# Patient Record
Sex: Male | Born: 1960 | Race: White | Hispanic: No | Marital: Married | Smoking: Never smoker
Health system: Southern US, Community
[De-identification: ages and names within clinical notes are randomized; demographics above are authoritative.]

## PROBLEM LIST (undated history)

## (undated) DIAGNOSIS — L309 Dermatitis, unspecified: Secondary | ICD-10-CM

## (undated) DIAGNOSIS — N529 Male erectile dysfunction, unspecified: Secondary | ICD-10-CM

## (undated) DIAGNOSIS — G4733 Obstructive sleep apnea (adult) (pediatric): Secondary | ICD-10-CM

## (undated) DIAGNOSIS — Z8249 Family history of ischemic heart disease and other diseases of the circulatory system: Secondary | ICD-10-CM

## (undated) DIAGNOSIS — E785 Hyperlipidemia, unspecified: Secondary | ICD-10-CM

## (undated) DIAGNOSIS — J45909 Unspecified asthma, uncomplicated: Secondary | ICD-10-CM

## (undated) DIAGNOSIS — E669 Obesity, unspecified: Secondary | ICD-10-CM

## (undated) HISTORY — DX: Unspecified asthma, uncomplicated: J45.909

## (undated) HISTORY — DX: Obesity, unspecified: E66.9

## (undated) HISTORY — PX: VASECTOMY: SHX75

## (undated) HISTORY — DX: Obstructive sleep apnea (adult) (pediatric): G47.33

## (undated) HISTORY — DX: Male erectile dysfunction, unspecified: N52.9

## (undated) HISTORY — DX: Family history of ischemic heart disease and other diseases of the circulatory system: Z82.49

## (undated) HISTORY — DX: Dermatitis, unspecified: L30.9

## (undated) HISTORY — PX: HERNIA REPAIR: SHX51

## (undated) HISTORY — DX: Hyperlipidemia, unspecified: E78.5

---

## 2001-05-28 ENCOUNTER — Ambulatory Visit (HOSPITAL_BASED_OUTPATIENT_CLINIC_OR_DEPARTMENT_OTHER): Admission: RE | Admit: 2001-05-28 | Discharge: 2001-05-28 | Payer: Self-pay | Admitting: Sports Medicine

## 2012-11-16 ENCOUNTER — Encounter: Payer: Self-pay | Admitting: Cardiology

## 2012-11-16 ENCOUNTER — Encounter: Payer: Self-pay | Admitting: *Deleted

## 2012-11-16 DIAGNOSIS — L309 Dermatitis, unspecified: Secondary | ICD-10-CM | POA: Insufficient documentation

## 2012-11-16 DIAGNOSIS — N529 Male erectile dysfunction, unspecified: Secondary | ICD-10-CM | POA: Insufficient documentation

## 2012-11-16 DIAGNOSIS — E785 Hyperlipidemia, unspecified: Secondary | ICD-10-CM | POA: Insufficient documentation

## 2012-11-16 DIAGNOSIS — E669 Obesity, unspecified: Secondary | ICD-10-CM | POA: Insufficient documentation

## 2012-11-16 DIAGNOSIS — G4733 Obstructive sleep apnea (adult) (pediatric): Secondary | ICD-10-CM | POA: Insufficient documentation

## 2012-11-16 DIAGNOSIS — J45909 Unspecified asthma, uncomplicated: Secondary | ICD-10-CM | POA: Insufficient documentation

## 2012-11-17 ENCOUNTER — Encounter: Payer: Self-pay | Admitting: Cardiology

## 2012-11-17 ENCOUNTER — Encounter (INDEPENDENT_AMBULATORY_CARE_PROVIDER_SITE_OTHER): Payer: Self-pay

## 2012-11-17 ENCOUNTER — Ambulatory Visit (INDEPENDENT_AMBULATORY_CARE_PROVIDER_SITE_OTHER): Payer: BC Managed Care – PPO | Admitting: Cardiology

## 2012-11-17 VITALS — BP 145/86 | HR 58 | Ht 71.0 in | Wt 291.0 lb

## 2012-11-17 DIAGNOSIS — R079 Chest pain, unspecified: Secondary | ICD-10-CM

## 2012-11-17 DIAGNOSIS — E785 Hyperlipidemia, unspecified: Secondary | ICD-10-CM

## 2012-11-17 DIAGNOSIS — Z8249 Family history of ischemic heart disease and other diseases of the circulatory system: Secondary | ICD-10-CM | POA: Insufficient documentation

## 2012-11-17 DIAGNOSIS — G4733 Obstructive sleep apnea (adult) (pediatric): Secondary | ICD-10-CM

## 2012-11-17 HISTORY — DX: Family history of ischemic heart disease and other diseases of the circulatory system: Z82.49

## 2012-11-17 MED ORDER — NITROGLYCERIN 0.4 MG SL SUBL: 0.4000 mg | SUBLINGUAL_TABLET | SUBLINGUAL | Status: DC | PRN

## 2012-11-17 NOTE — Patient Instructions (Addendum)
Your physician has requested that you have an exercise tolerance test. For further information please visit [email or web address] Please also follow instruction sheet, as given.  Your physician wants you to follow-up in: 2 Years with Dr. Anne Fu. You will receive a reminder letter in the mail two months in advance. If you don't receive a letter, please call our office to schedule the follow-up appointment.  Rx for [employer] sent into [facility].

## 2012-11-17 NOTE — Progress Notes (Signed)
[address] [place], [place]  [address] Phone: [phone] Fax:  [phone]  Date:  11/17/2012   ID:  Herbert Wilson, DOB [birth year], MRN [id number]  PCP:  Farris Has, MD   History of Present Illness: Herbert Wilson is a 52 y.o. male here for the evaluation of chest pain with family history of coronary artery disease, hyperlipidemia, obesity.  Chest pain has been intermittent over the past 3-4 years no previous stress test. His father had his first myocardial infarction in his 79s with subsequent CABG. His father used to take nitroglycerin for his chest pain. Can feels CP with or without exertion. More frequent over the years. Really wants NTG Rx. Occasional bilateral chest pain, not terribly bad he states. No dyspnea.   LDL cholesterol 150. Currently does not wish to take statin therapy because of memory loss on statins.  EKG personally reviewed and demonstrates sinus bradycardia rate 53 with no other significant abnormalities from 10/21/12.  Nonsmoker. Works for [employer]. Mild alcohol use. He is also being treated for obstructive sleep apnea with CPAP (helped with memory) as well as erectile dysfunction with Cialis.    Wt Readings from Last 3 Encounters:  11/17/12 291 lb (131.997 kg)     Past Medical History  Diagnosis Date  . Hyperlipidemia   . Erectile dysfunction   . Obesity   . OSA (obstructive sleep apnea)     PSG 10/20/09, ESS 5, AHI 9/hr REM 27/hr, RDI 16/hr REM 41/hr, O2 nadir 85%; CPAP 11/03/09 CPAP 9 with AHI 0.5/hr)  . Asthma   . Eczema     Past Surgical History  Procedure Laterality Date  . Hernia repair    . Vasectomy      Current Outpatient Prescriptions  Medication Sig Dispense Refill  . albuterol (PROVENTIL HFA;VENTOLIN HFA) 108 (90 BASE) MCG/ACT inhaler Inhale 2 puffs into the lungs every 6 (six) hours as needed for wheezing.      . benzonatate (TESSALON PERLES) 100 MG capsule Take 100 mg by mouth as needed. One tab        . carisoprodol (SOMA) 350 MG tablet Take 350 mg by mouth as needed for muscle spasms.      Marland Kitchen ezetimibe (ZETIA) 10 MG tablet Take 10 mg by mouth daily.      Marland Kitchen loratadine (CLARITIN) 10 MG tablet Take 10 mg by mouth as needed.       . Multiple Vitamins-Minerals (MULTIVITAMIN PO) Take by mouth daily.      . tadalafil (CIALIS) 20 MG tablet Take 20 mg by mouth daily as needed for erectile dysfunction.      . triamcinolone cream (KENALOG) 0.1 % Apply topically as needed.       No current facility-administered medications for this visit.    Allergies:    Allergies  Allergen Reactions  . Lipitor [Atorvastatin]     Memory Loss  . Penicillins     Hives    Social History:  The patient  reports that he has never smoked. He does not have any smokeless tobacco history on file. occasional alcohol use. Works at [employer].  Family History  Problem Relation Age of Onset  . Hypertension Father   . Heart disease Father   . Crohn's disease Brother   . Irritable bowel syndrome Mother   . Heart disease Other   . Cancer - Other Other    Father had first heart attack in his mid-50s.  ROS:  Please see the history of present illness.   Chest pain as noted above. Minimal shortness of breath with activity. No strokelike symptoms, no fevers, no chills, no orthopnea, no PND. Positive sleep apnea. Previous short-term memory issues-Lipitor especially. No bleeding. Unless stated above, all other review of systems negative.  PHYSICAL EXAM: VS:  BP 145/86  Pulse 58  Ht 5\' 11"  (1.803 m)  Wt 291 lb (131.997 kg)  BMI 40.6 kg/m2 Well nourished, well developed, in no acute distress HEENT: normal, no carotid bruits, no masses, thick Neck: no JVD Cardiac:  normal S1, S2; RRR; no murmur no gallops no rubs Lungs:  clear to auscultation bilaterally, no wheezing, rhonchi or rales, normal respiratory effort Abd: soft, nontender, no hepatomegaly, overweight Ext: no edema, 2+ distal pulses, no clubbing Skin:  warm and dry, no rashes Neuro: no focal abnormalities noted, moves all extremities x4 GU: Deferred Rectal: Deferred  EKG:  Sinus bradycardia, as described above LDL 150  ASSESSMENT AND PLAN:  1. Atypical chest pain-I will order exercise treadmill test for further risk stratification. We discussed at length primary prevention strategies. He understands that if symptoms worsen or become more worrisome, further cardiac testing may be warranted. He strongly requested nitroglycerin. I counseled him on medication. He knows not to take both Cialis or nitroglycerin together. His father use nitroglycerin and had much relief from this. I do have any problem with him trying this medication. Headache may be a side effect. Decrease in blood pressure may be a side effect. 2. Morbid Obesity-BMI greater than 40. discussed weight loss plans. Weight Watchers validated plan. Discussed low carbohydrate diet. Exercise daily. 3. Hyperlipidemia-he is had intolerance to Lipitor in the past and is not wanting to take another statin at this time. Currently on Zetia. I described to him the data behind both statins as well as Zetia.  4. Obstructive sleep apnea-applauded his use of CPAP. He states that his memory and clarity from today is much improved with treatment of obstructive sleep apnea. 5. Strong family history of CAD-as described above. Prevention strategies.  Signed, Donato Schultz, MD [facility]  11/17/2012 11:43 AM

## 2012-11-19 ENCOUNTER — Telehealth: Payer: Self-pay | Admitting: Cardiology

## 2012-11-19 NOTE — Telephone Encounter (Signed)
New message    Question about drug interaction---Ref # [id number]----GEXBM stat and cilias----

## 2012-11-20 NOTE — Telephone Encounter (Signed)
Patient was informed that they can not be taken with 24hrs of each other.

## 2012-12-25 ENCOUNTER — Ambulatory Visit (INDEPENDENT_AMBULATORY_CARE_PROVIDER_SITE_OTHER): Payer: BC Managed Care – PPO | Admitting: Physician Assistant

## 2012-12-25 ENCOUNTER — Encounter (INDEPENDENT_AMBULATORY_CARE_PROVIDER_SITE_OTHER): Payer: Self-pay

## 2012-12-25 DIAGNOSIS — R079 Chest pain, unspecified: Secondary | ICD-10-CM

## 2012-12-25 NOTE — Progress Notes (Signed)
Overall reassuring treadmill. If he would like, I can see him back in one year given his strong family history.

## 2012-12-25 NOTE — Progress Notes (Signed)
Exercise Treadmill Test  Pre-Exercise Testing Evaluation Rhythm: normal sinus  Rate: 58 BPM     Test  Exercise Tolerance Test Ordering MD: Donato Schultz, MD  Interpreting MD: Tereso Newcomer, PA-C  Unique Test No: 1  Treadmill:  1  Indication for ETT: chest pain - rule out ischemia  Contraindication to ETT: No   Stress Modality: exercise - treadmill  Cardiac Imaging Performed: non   Protocol: standard Bruce - maximal  Max BP:  182/72  Max MPHR (bpm):  169 85% MPR (bpm):  144  MPHR obtained (bpm):  153 % MPHR obtained:  90  Reached 85% MPHR (min:sec):  9:20 Total Exercise Time (min-sec):  10:00  Workload in METS:  11.8 Borg Scale: 17  Reason ETT Terminated:  desired heart rate attained    ST Segment Analysis At Rest: normal ST segments - no evidence of significant ST depression With Exercise: no evidence of significant ST depression  Other Information Arrhythmia:  No Angina during ETT:  absent (0) Quality of ETT:  diagnostic  ETT Interpretation:  normal - no evidence of ischemia by ST analysis  Comments: Good exercise capacity. No chest pain. Normal BP response to exercise. No ST changes to suggest ischemia.   Recommendations: F/u with Dr. Donato Schultz as directed. Signed,  Tereso Newcomer, PA-C   12/25/2012 9:48 AM

## 2012-12-29 ENCOUNTER — Other Ambulatory Visit: Payer: Self-pay | Admitting: Cardiology

## 2012-12-29 MED ORDER — NITROGLYCERIN 0.4 MG SL SUBL: 0.4000 mg | SUBLINGUAL_TABLET | SUBLINGUAL | Status: DC | PRN

## 2013-01-05 ENCOUNTER — Other Ambulatory Visit: Payer: Self-pay | Admitting: Cardiology

## 2013-01-13 ENCOUNTER — Other Ambulatory Visit: Payer: Self-pay

## 2013-05-25 ENCOUNTER — Encounter: Payer: Self-pay | Admitting: Family Medicine

## 2014-03-29 ENCOUNTER — Ambulatory Visit (INDEPENDENT_AMBULATORY_CARE_PROVIDER_SITE_OTHER): Payer: BLUE CROSS/BLUE SHIELD | Admitting: Cardiology

## 2014-03-29 ENCOUNTER — Encounter: Payer: Self-pay | Admitting: Cardiology

## 2014-03-29 VITALS — BP 140/82 | HR 68 | Ht 70.0 in | Wt 288.0 lb

## 2014-03-29 DIAGNOSIS — Z8249 Family history of ischemic heart disease and other diseases of the circulatory system: Secondary | ICD-10-CM

## 2014-03-29 DIAGNOSIS — G4733 Obstructive sleep apnea (adult) (pediatric): Secondary | ICD-10-CM

## 2014-03-29 DIAGNOSIS — E785 Hyperlipidemia, unspecified: Secondary | ICD-10-CM

## 2014-03-29 MED ORDER — NITROGLYCERIN 0.4 MG SL SUBL: 0.4000 mg | SUBLINGUAL_TABLET | SUBLINGUAL | Status: AC | PRN

## 2014-03-29 NOTE — Progress Notes (Signed)
[address] [place], [place]  [address] Phone: [phone]) [phone] Fax:  [phone]  Date:  03/29/2014   ID:  Herbert RecordKurt C Shad, DOB [birth year], MRN [id number]  PCP:  Farris HasMORROW, AARON, MD   History of Present Illness: Herbert Wilson is a 54 y.o. male here for the evaluation of chest pain with family history of coronary artery disease, hyperlipidemia, obesity.  Chest pain has been intermittent over the past 3-4 years no previous stress test. His father had his first myocardial infarction in his 8150s with subsequent CABG. His father used to take nitroglycerin for his chest pain. Can feels CP with or without exertion. More frequent over the years. Really wants NTG Rx. Occasional bilateral chest pain, not terribly bad he states. No dyspnea.   LDL cholesterol 150. Currently does not wish to take statin therapy because of memory loss on statins.  EKG personally reviewed and demonstrates sinus bradycardia rate 53 with no other significant abnormalities from 10/21/12.  Nonsmoker. Works for [employer]. Mild alcohol use. He is also being treated for obstructive sleep apnea with CPAP (helped with memory) as well as erectile dysfunction with Cialis.  His uncle, Nadine CountsBob, had bypass surgery and did not have any symptoms previous to this he states.    Wt Readings from Last 3 Encounters:  03/29/14 288 lb (130.636 kg)  11/17/12 291 lb (131.997 kg)     Past Medical History  Diagnosis Date  . Hyperlipidemia   . Erectile dysfunction   . Obesity   . OSA (obstructive sleep apnea)     PSG 10/20/09, ESS 5, AHI 9/hr REM 27/hr, RDI 16/hr REM 41/hr, O2 nadir 85%; CPAP 11/03/09 CPAP 9 with AHI 0.5/hr)  . Asthma   . Eczema   . Family history of ischemic heart disease 11/17/2012    Past Surgical History  Procedure Laterality Date  . Hernia repair    . Vasectomy      Current Outpatient Prescriptions  Medication Sig Dispense Refill  . albuterol (PROVENTIL HFA;VENTOLIN HFA) 108 (90 BASE)  MCG/ACT inhaler Inhale 2 puffs into the lungs every 6 (six) hours as needed for wheezing.    . benzonatate (TESSALON PERLES) 100 MG capsule Take 100 mg by mouth as needed. One tab    . carisoprodol (SOMA) 350 MG tablet Take 350 mg by mouth as needed for muscle spasms.    Marland Kitchen. ezetimibe (ZETIA) 10 MG tablet Take 10 mg by mouth daily.    Marland Kitchen. loratadine (CLARITIN) 10 MG tablet Take 10 mg by mouth as needed.     . Multiple Vitamins-Minerals (MULTIVITAMIN PO) Take by mouth daily.    . nitroGLYCERIN (NITROSTAT) 0.4 MG SL tablet Place 1 tablet (0.4 mg total) under the tongue every 5 (five) minutes as needed for chest pain. 45 tablet 4  . tadalafil (CIALIS) 20 MG tablet Take 20 mg by mouth daily as needed for erectile dysfunction.    . triamcinolone cream (KENALOG) 0.1 % Apply topically as needed.     No current facility-administered medications for this visit.    Allergies:    Allergies  Allergen Reactions  . Lipitor [Atorvastatin]     Memory Loss  . Penicillins     Hives    Social History:  The patient  reports that he has never smoked. He does not have any smokeless tobacco history on file. occasional alcohol use. Works at [employer].  Family History  Problem Relation Age of Onset  . Hypertension  Father   . Heart disease Father   . Crohn's disease Brother   . Irritable bowel syndrome Mother   . Heart disease Other   . Cancer - Other Other    Father had first heart attack in his mid-50s.  ROS:  Please see the history of present illness.   Chest pain as noted above. Minimal shortness of breath with activity. No strokelike symptoms, no fevers, no chills, no orthopnea, no PND. Positive sleep apnea. Previous short-term memory issues-Lipitor especially. No bleeding. Unless stated above, all other review of systems negative.  PHYSICAL EXAM: VS:  BP 140/82 mmHg  Pulse 68  Ht  (1.778 m)  Wt 288 lb (130.636 kg)  BMI 41.32 kg/m2 Well nourished, well developed, in no acute  distress HEENT: normal, no carotid bruits, no masses, thick Neck: no JVD Cardiac:  normal S1, S2; RRR; no murmur no gallops no rubs Lungs:  clear to auscultation bilaterally, no wheezing, rhonchi or rales, normal respiratory effort Abd: soft, nontender, no hepatomegaly, overweight Ext: no edema, 2+ distal pulses, no clubbing Skin: warm and dry, no rashes Neuro: no focal abnormalities noted, moves all extremities x4 GU: Deferred Rectal: Deferred  EKG:  03/29/14-sinus rhythm, poor R-wave progression, otherwise normal.   LDL 150  ASSESSMENT AND PLAN:  1. Atypical chest pain- treadmill test was reassuring in 2014. No ischemia.. We discussed at length primary prevention strategies. He understands that if symptoms worsen or become more worrisome, further cardiac testing may be warranted. He strongly requested nitroglycerin. I counseled him on medication. He knows not to take both Cialis or nitroglycerin together. His father use nitroglycerin and had much relief from this. I do have any problem with him trying this medication. Headache may be a side effect. Decrease in blood pressure may be a side effect. 2. Morbid Obesity-BMI greater than 40. discussed weight loss plans. Weight Watchers validated plan. Discussed low carbohydrate diet. Exercise daily. 3. Hyperlipidemia-he is had intolerance to Lipitor in the past and is not wanting to take another statin at this time. Currently on Zetia. I described to him the data behind both statins as well as Zetia.  4. Obstructive sleep apnea-applauded his use of CPAP. He states that his memory and clarity from today is much improved with treatment of obstructive sleep apnea. 5. Strong family history of CAD-as described above. Prevention strategies. 6. We will see back in 2 years. I do not think that further stress testing is needed at this time.  Signed, Donato Schultz, MD [facility]  03/29/2014 4:38 PM

## 2014-03-29 NOTE — Patient Instructions (Signed)
The current medical regimen is effective;  continue present plan and medications.  Follow up in 2 years with Dr. Anne FuSkains.  You will receive a letter in the mail 2 months before you are due.  Please call us when you receive this letter to schedule your follow up appointment.  Thank you for choosing [facility]!!

## 2016-06-21 ENCOUNTER — Ambulatory Visit
Admission: RE | Admit: 2016-06-21 | Discharge: 2016-06-21 | Disposition: A | Discharge status: Home / Self Care | Payer: BLUE CROSS/BLUE SHIELD | Source: Ambulatory Visit | Attending: Family Medicine | Admitting: Family Medicine

## 2016-06-21 ENCOUNTER — Other Ambulatory Visit: Payer: Self-pay | Admitting: Family Medicine

## 2016-06-21 DIAGNOSIS — R109 Unspecified abdominal pain: Secondary | ICD-10-CM

## 2016-06-21 MED ORDER — IOPAMIDOL (ISOVUE-300) INJECTION 61%
125.0000 mL | Freq: Once | INTRAVENOUS | Status: AC | PRN
  Administered 2016-06-21: 125 mL via INTRAVENOUS

## 2016-08-24 ENCOUNTER — Other Ambulatory Visit (HOSPITAL_COMMUNITY): Payer: Self-pay | Admitting: Surgery

## 2016-09-06 ENCOUNTER — Ambulatory Visit (HOSPITAL_COMMUNITY)
Admission: RE | Admit: 2016-09-06 | Discharge: 2016-09-06 | Disposition: A | Discharge status: Home / Self Care | Payer: BLUE CROSS/BLUE SHIELD | Source: Ambulatory Visit | Attending: Surgery | Admitting: Surgery

## 2016-09-06 ENCOUNTER — Other Ambulatory Visit (HOSPITAL_COMMUNITY): Payer: Self-pay | Admitting: Surgery

## 2016-09-06 ENCOUNTER — Encounter: Payer: Self-pay | Admitting: Registered"

## 2016-09-06 ENCOUNTER — Encounter: Payer: BLUE CROSS/BLUE SHIELD | Attending: Surgery | Admitting: Registered"

## 2016-09-06 ENCOUNTER — Other Ambulatory Visit: Payer: Self-pay

## 2016-09-06 DIAGNOSIS — Z01818 Encounter for other preprocedural examination: Secondary | ICD-10-CM | POA: Diagnosis not present

## 2016-09-06 DIAGNOSIS — Z6841 Body Mass Index (BMI) 40.0 and over, adult: Secondary | ICD-10-CM | POA: Diagnosis not present

## 2016-09-06 DIAGNOSIS — K760 Fatty (change of) liver, not elsewhere classified: Secondary | ICD-10-CM | POA: Diagnosis not present

## 2016-09-06 DIAGNOSIS — R001 Bradycardia, unspecified: Secondary | ICD-10-CM | POA: Insufficient documentation

## 2016-09-06 DIAGNOSIS — E669 Obesity, unspecified: Secondary | ICD-10-CM

## 2016-09-06 NOTE — Progress Notes (Signed)
Pre-Op Assessment Visit:  Pre-Operative Sleeve gastrectomy Surgery  Medical Nutrition Therapy:  Appt start time: 3:15  End time:  4:20  Patient was seen on 09/06/2016 for Pre-Operative Nutrition Assessment. Assessment and letter of approval [facility] coordinator on 09/06/2016.   Pt expectation of surgery: lose at least 50 lbs in a year, to get cholesterol under control (allergic to statins), prevent diabetes  Pt expectation of Dietitian: None stated  Start weight at [facility]: 289.3 BMI: 42.72   Pt states his major issue is portion control and binge eating when feeling like he needs a reward (work-related) or frustrated. Pt states he lost his job and currently job hunting. Pt states he has done a lot of research and aware of most recommendations related to bariatric surgery.   Per insurance, pt needs 6 SWL visits prior to surgery.    24 hr Dietary Recall: First Meal: [employer]-2 XL coffees (black), breakfast burrito bowl w/o tortilla Snack: none Second Meal: Z[employer]by[employer]-salad Snack: none Third Meal: rotisserie chicken, salad Snack: none Beverages: coffee, water, alcohol, soda (rarely)   Encouraged to engage in 150 minutes of moderate physical activity including cardiovascular and weight baring weekly  Handouts given during visit include:  . Pre-Op Goals . Bariatric Surgery Protein Shakes . Vitamin and Mineral Recommendations  During the appointment today the following Pre-Op Goals were reviewed with the patient: . Maintain or lose weight as instructed by your surgeon . Make healthy food choices . Begin to limit portion sizes . Limited concentrated sugars and fried foods . Keep fat/sugar in the single digits per serving on          food labels . Practice CHEWING your food  (aim for 30 chews per bite or until applesauce consistency) . Practice not drinking 15 minutes before, during, and 30 minutes after each meal/snack . Avoid all  carbonated beverages  . Avoid/limit caffeinated beverages  . Avoid all sugar-sweetened beverages . Consume 3 meals per day; eat every 3-5 hours . Make a list of non-food related activities . Aim for 64-100 ounces of FLUID daily  . Aim for at least 60-80 grams of PROTEIN daily . Look for a liquid protein source that contain ?15 g protein and ?5 g carbohydrate  (ex: shakes, drinks, shots) . Physical activity is an important part of a healthy lifestyle so keep it moving!  Follow diet recommendations listed below Energy and Macronutrient Recommendations: Calories: 2000 Carbohydrate: 225 Protein: 150 Fat: 56  Demonstrated degree of understanding via:  Teach Back   Teaching Method Utilized:  Visual Auditory Hands on  Barriers to learning/adherence to lifestyle change: none  Patient to call the Nutrition and Diabetes Education Services to enroll in Pre-Op and Post-Op Nutrition Education when surgery date is scheduled.

## 2016-10-03 ENCOUNTER — Ambulatory Visit: Payer: BLUE CROSS/BLUE SHIELD | Admitting: Registered"

## 2019-01-16 IMAGING — RF DG UGI W/ KUB
14 series · 14 of 14 positions shown · non-contrast
Comparison: None.

CLINICAL DATA: Preop sleeve gastrectomy.

EXAM:
UPPER GI SERIES WITH KUB
TECHNIQUE: After obtaining a scout radiograph a routine upper GI series was
performed using thin barium
FLUOROSCOPY TIME:  Fluoroscopy Time:  1 minutes 12 seconds
Radiation Exposure Index (if provided by the fluoroscopic device):
20.2 mGy
Number of Acquired Spot Images: 0

[Series 1: t abdomen supine · 0.15mm/px · 1 of 1 slices shown]
[im 1/1]
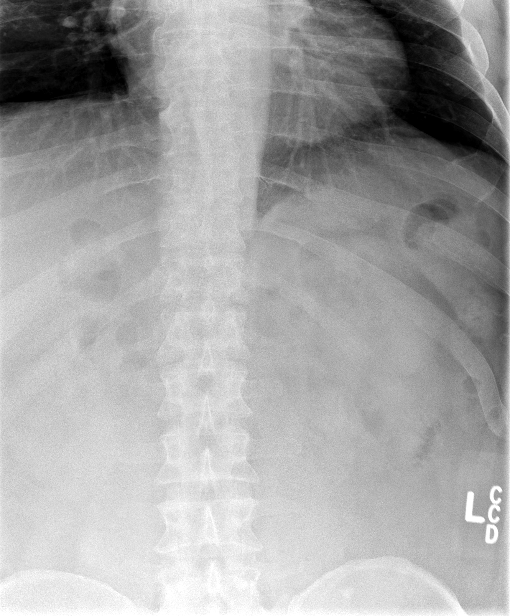

[Series 2: cp_standard · 0.27mm/px · 1 of 1 slices shown (1 of 13)]
[im 1/1]
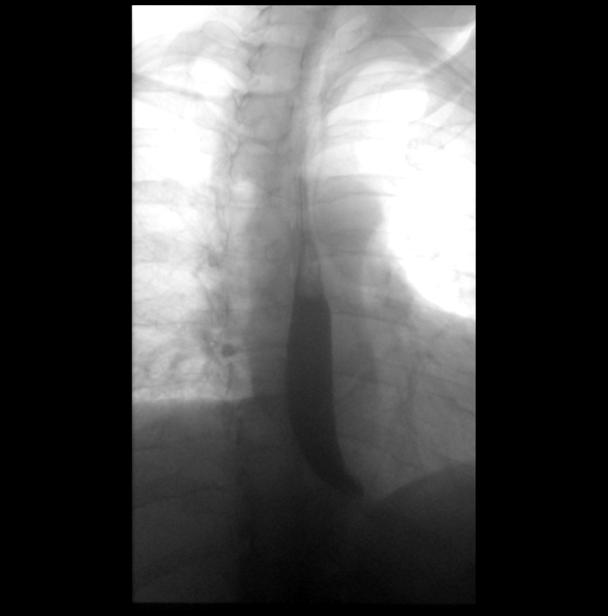

[Series 3: cp_standard · 0.27mm/px · 1 of 1 slices shown (2 of 13)]
[im 1/1]
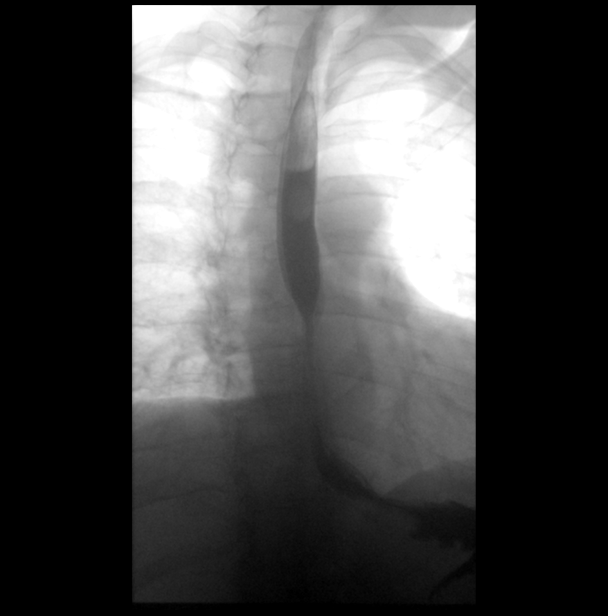

[Series 4: cp_standard · 0.27mm/px · 1 of 1 slices shown (3 of 13)]
[im 1/1]
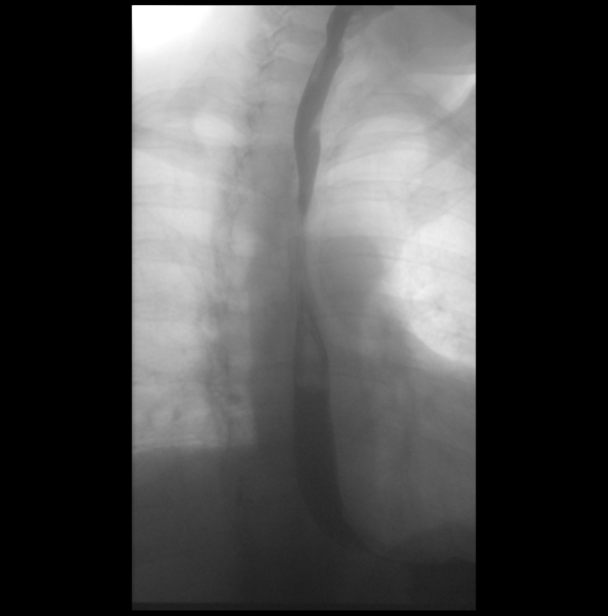

[Series 5: cp_standard · 0.27mm/px · 1 of 1 slices shown (4 of 13)]
[im 1/1]
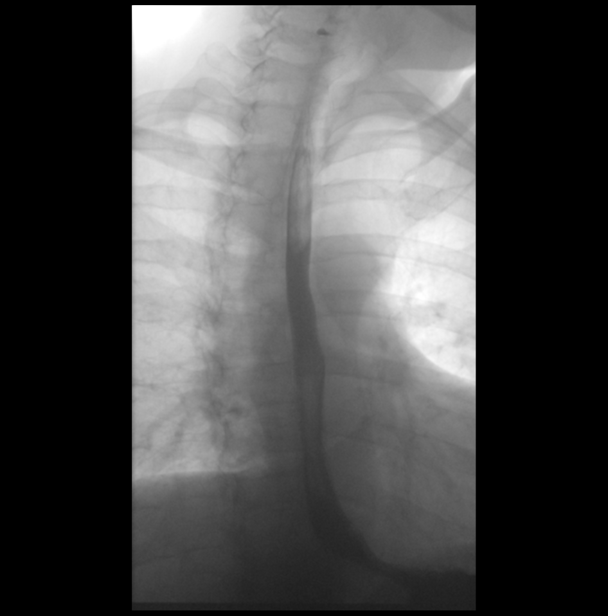

[Series 6: cp_standard · 0.27mm/px · 1 of 1 slices shown (5 of 13)]
[im 1/1]
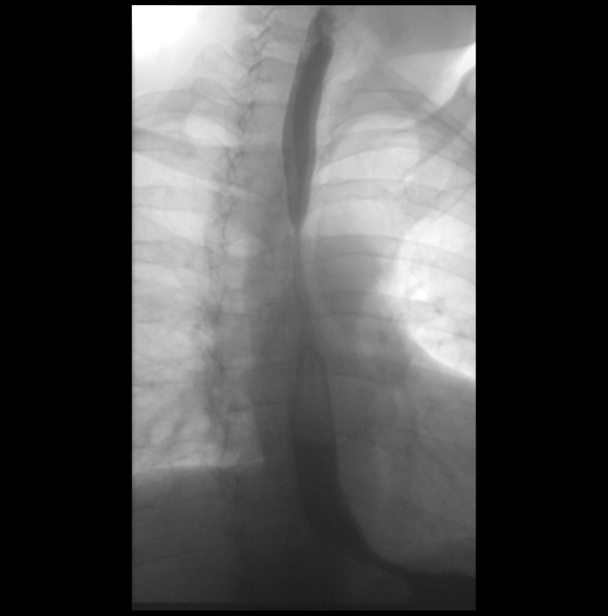

[Series 7: cp_standard · 0.27mm/px · 1 of 1 slices shown (6 of 13)]
[im 1/1]
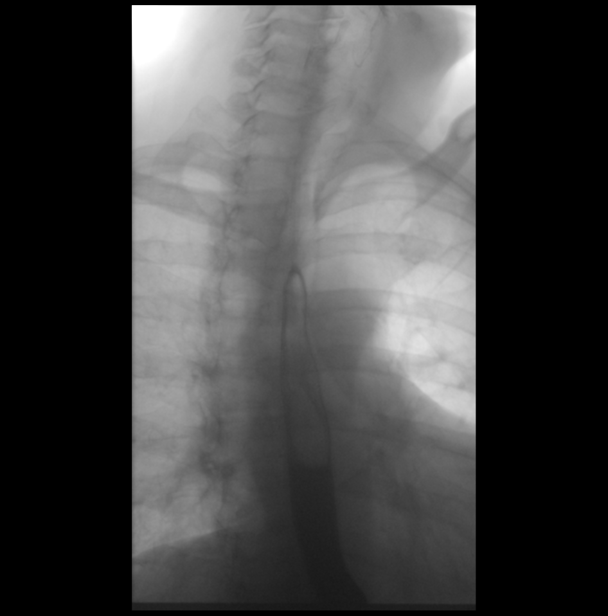

[Series 8: cp_standard · 0.27mm/px · 1 of 1 slices shown (7 of 13)]
[im 1/1]
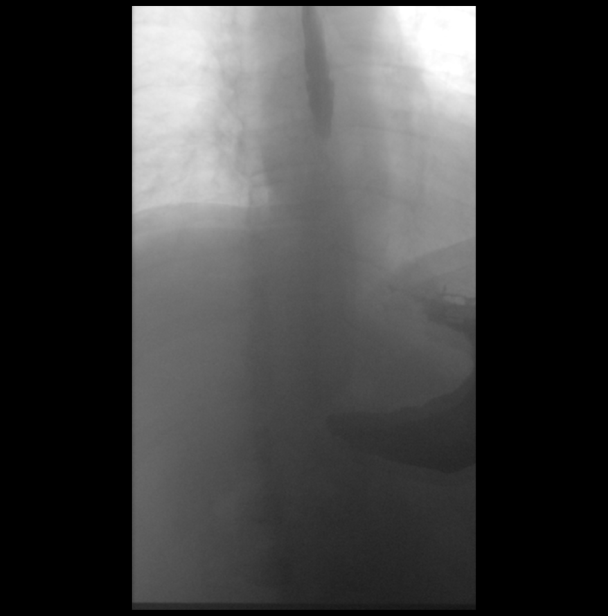

[Series 9: cp_standard · 0.18mm/px · 1 of 1 slices shown (8 of 13)]
[im 1/1]
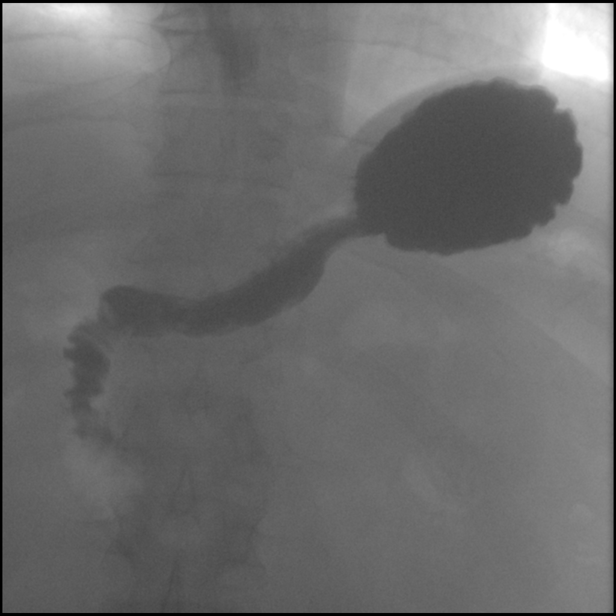

[Series 10: cp_standard · 0.18mm/px · 1 of 1 slices shown (9 of 13)]
[im 1/1]
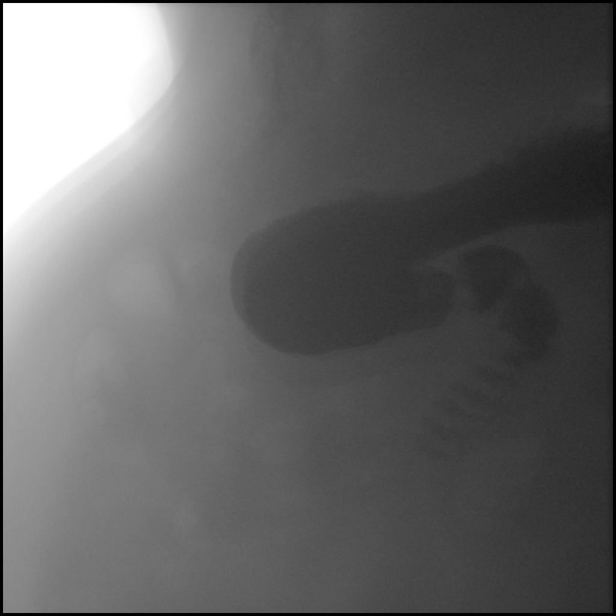

[Series 11: cp_standard · 0.18mm/px · 1 of 1 slices shown (10 of 13)]
[im 1/1]
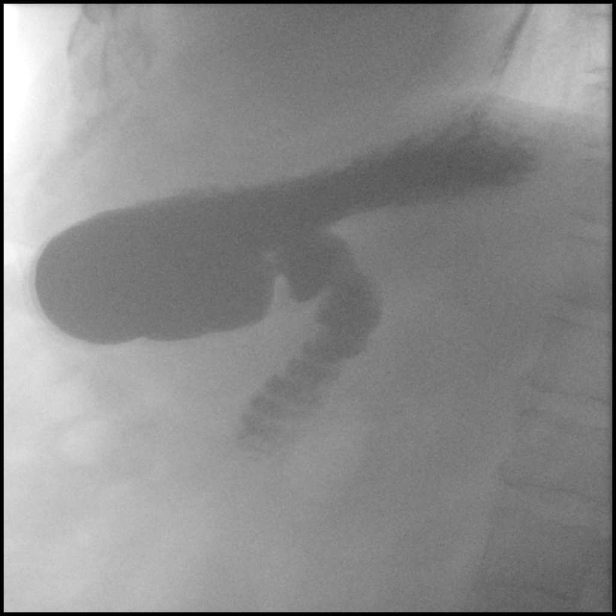

[Series 12: cp_standard · 0.18mm/px · 1 of 1 slices shown (11 of 13)]
[im 1/1]
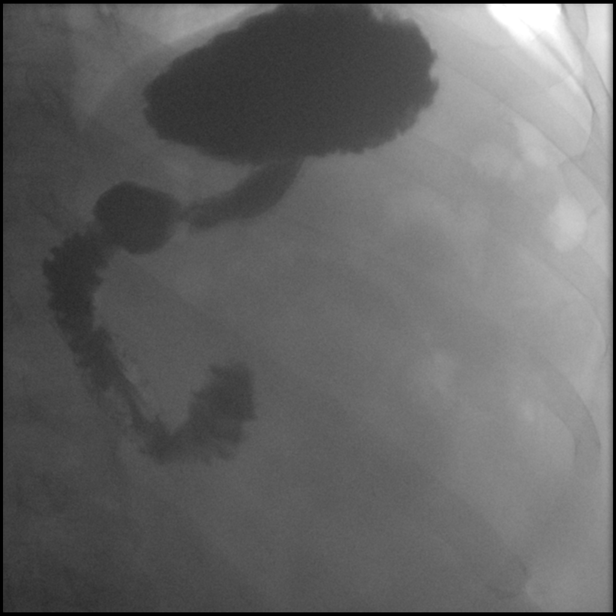

[Series 13: cp_standard · 0.18mm/px · 1 of 1 slices shown (12 of 13)]
[im 1/1]
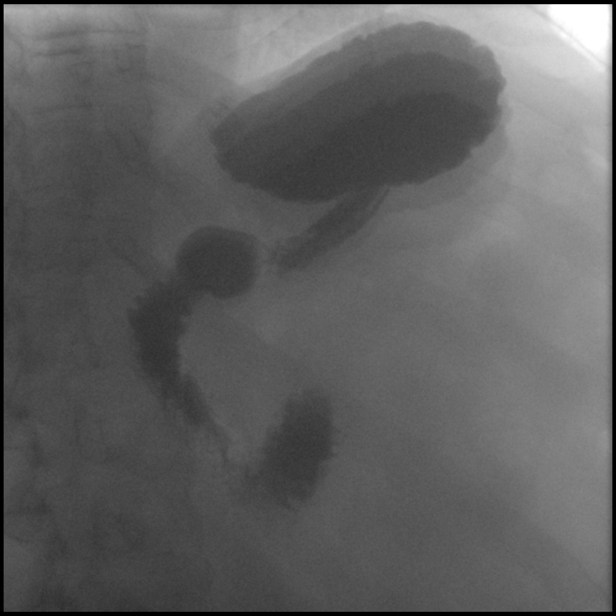

[Series 14: cp_standard · 0.18mm/px · 1 of 1 slices shown (13 of 13)]
[im 1/1]
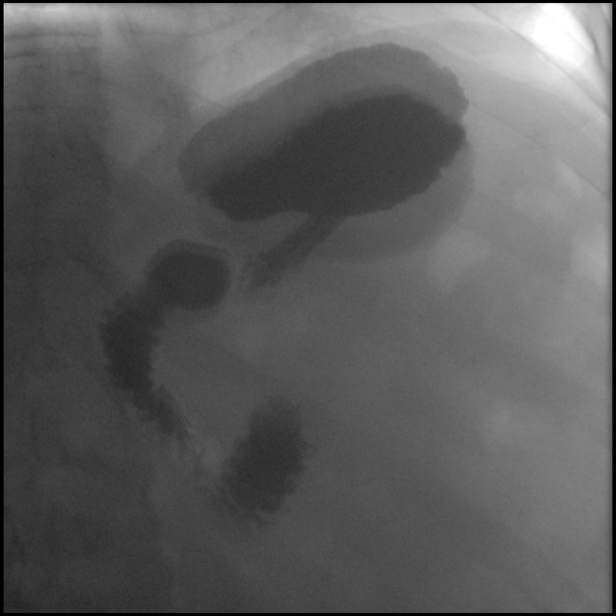

[14 of 14 positions shown; findings below may reference images not displayed]

FINDINGS: The scout radiograph shows a normal bowel gas pattern. The esophagus
is patent. No stricture or mass identified. The stomach it has a
normal configuration. No hiatal hernia. The duodenal bulb and sweep
are unremarkable. No reflux identified.
IMPRESSION: 1. Normal exam.
